# Patient Record
Sex: Female | Born: 1990 | Race: Black or African American | Hispanic: No | Marital: Single | Smoking: Never smoker
Health system: Southern US, Community
[De-identification: ages and names within clinical notes are randomized; demographics above are authoritative.]

## PROBLEM LIST (undated history)

## (undated) DIAGNOSIS — Z789 Other specified health status: Secondary | ICD-10-CM

## (undated) HISTORY — PX: NO PAST SURGERIES: SHX2092

---

## 2020-06-08 ENCOUNTER — Encounter (HOSPITAL_COMMUNITY): Payer: Self-pay | Admitting: Emergency Medicine

## 2020-06-08 ENCOUNTER — Other Ambulatory Visit: Payer: Self-pay

## 2020-06-08 ENCOUNTER — Inpatient Hospital Stay (HOSPITAL_COMMUNITY): Payer: Medicaid Other

## 2020-06-08 ENCOUNTER — Inpatient Hospital Stay (HOSPITAL_COMMUNITY)
Admission: AD | Admit: 2020-06-08 | Discharge: 2020-06-08 | Disposition: A | Discharge status: Home / Self Care | Payer: Medicaid Other | Attending: Obstetrics and Gynecology | Admitting: Obstetrics and Gynecology

## 2020-06-08 DIAGNOSIS — O23591 Infection of other part of genital tract in pregnancy, first trimester: Secondary | ICD-10-CM

## 2020-06-08 DIAGNOSIS — A5901 Trichomonal vulvovaginitis: Secondary | ICD-10-CM | POA: Diagnosis not present

## 2020-06-08 DIAGNOSIS — Z3A01 Less than 8 weeks gestation of pregnancy: Secondary | ICD-10-CM | POA: Diagnosis not present

## 2020-06-08 DIAGNOSIS — O98311 Other infections with a predominantly sexual mode of transmission complicating pregnancy, first trimester: Secondary | ICD-10-CM | POA: Diagnosis not present

## 2020-06-08 DIAGNOSIS — O210 Mild hyperemesis gravidarum: Secondary | ICD-10-CM | POA: Diagnosis not present

## 2020-06-08 DIAGNOSIS — O26891 Other specified pregnancy related conditions, first trimester: Secondary | ICD-10-CM | POA: Insufficient documentation

## 2020-06-08 DIAGNOSIS — R109 Unspecified abdominal pain: Secondary | ICD-10-CM | POA: Diagnosis not present

## 2020-06-08 DIAGNOSIS — O4691 Antepartum hemorrhage, unspecified, first trimester: Secondary | ICD-10-CM | POA: Diagnosis not present

## 2020-06-08 DIAGNOSIS — O26851 Spotting complicating pregnancy, first trimester: Secondary | ICD-10-CM | POA: Diagnosis not present

## 2020-06-08 DIAGNOSIS — O219 Vomiting of pregnancy, unspecified: Secondary | ICD-10-CM | POA: Insufficient documentation

## 2020-06-08 DIAGNOSIS — O209 Hemorrhage in early pregnancy, unspecified: Secondary | ICD-10-CM

## 2020-06-08 DIAGNOSIS — Z3491 Encounter for supervision of normal pregnancy, unspecified, first trimester: Secondary | ICD-10-CM

## 2020-06-08 HISTORY — DX: Other specified health status: Z78.9

## 2020-06-08 LAB — CBC
HCT: 36.5 % (ref 36.0–46.0)
Hemoglobin: 11.6 g/dL — ABNORMAL LOW (ref 12.0–15.0)
MCH: 27.9 pg (ref 26.0–34.0)
MCHC: 31.8 g/dL (ref 30.0–36.0)
MCV: 87.7 fL (ref 80.0–100.0)
Platelets: 327 10*3/uL (ref 150–400)
RBC: 4.16 MIL/uL (ref 3.87–5.11)
RDW: 14.6 % (ref 11.5–15.5)
WBC: 9.4 10*3/uL (ref 4.0–10.5)
nRBC: 0 % (ref 0.0–0.2)

## 2020-06-08 LAB — URINALYSIS, ROUTINE W REFLEX MICROSCOPIC
Bilirubin Urine: NEGATIVE
Glucose, UA: NEGATIVE mg/dL
Ketones, ur: 80 mg/dL — AB
Nitrite: NEGATIVE
Protein, ur: 30 mg/dL — AB
Specific Gravity, Urine: 1.032 — ABNORMAL HIGH (ref 1.005–1.030)
pH: 5 (ref 5.0–8.0)

## 2020-06-08 LAB — HCG, QUANTITATIVE, PREGNANCY: hCG, Beta Chain, Quant, S: 52064 m[IU]/mL — ABNORMAL HIGH (ref <5)

## 2020-06-08 LAB — WET PREP, GENITAL
Sperm: NONE SEEN
Yeast Wet Prep HPF POC: NONE SEEN

## 2020-06-08 LAB — POC URINE PREG, ED: Preg Test, Ur: POSITIVE — AB

## 2020-06-08 MED ORDER — PROMETHAZINE HCL 25 MG PO TABS: 25.0000 mg | ORAL_TABLET | Freq: Four times a day (QID) | ORAL | 0 refills | Status: DC | PRN

## 2020-06-08 MED ORDER — METRONIDAZOLE IVPB CUSTOM
2000.0000 mg | Freq: Once | INTRAVENOUS | Status: AC
  Administered 2020-06-08: 2000 mg via INTRAVENOUS
  Filled 2020-06-08: qty 400

## 2020-06-08 MED ORDER — FAMOTIDINE IN NACL 20-0.9 MG/50ML-% IV SOLN
20.0000 mg | Freq: Once | INTRAVENOUS | Status: AC
  Administered 2020-06-08: 20 mg via INTRAVENOUS
  Filled 2020-06-08: qty 50

## 2020-06-08 MED ORDER — LACTATED RINGERS IV BOLUS
1000.0000 mL | Freq: Once | INTRAVENOUS | Status: AC
  Administered 2020-06-08: 1000 mL via INTRAVENOUS

## 2020-06-08 MED ORDER — SODIUM CHLORIDE 0.9 % IV SOLN
25.0000 mg | Freq: Once | INTRAVENOUS | Status: AC
  Administered 2020-06-08: 25 mg via INTRAVENOUS
  Filled 2020-06-08: qty 1

## 2020-06-08 NOTE — Discharge Instructions (Signed)
[facility] [employer] for [employer] at [facility]  [address] [place], [place] [address]  [phone]  Center for [facility] at [facility]  [address], [place], [place] [address]  [phone]  Center for [facility] at [facility] [address] [place], [place] [address]  [phone]  Center for [facility] at [facility]  [address] [place], [place] [address]  [phone]  Center for [facility] at [facility]  [address] floor), [place], [place] [address]  [phone]  Center for [facility] [address], [place], [place] [address] [phone]  Center for [facility] at [facility]  [address] [place], [place], [place] [address]  [phone]  [facility]  [address], [place], [place] [address]  [phone]  [facility]  [address] [place], [place], [place] [address]  [phone]  [facility]  [address] [place], [place] [address]  [phone]  [facility]  [address] [place], [place] [address]  [phone]  [facility]  [address], [place], [place] [address]  [phone]  [facility]   [address] [place], [place] [address]  [phone]  [facility]  [address], [place], [place] [address]   [phone]  [facility]  [address] [place], [place] [address]  [phone]          Trichomoniasis Trichomoniasis is an STI (sexually transmitted infection) that can affect both women and men. In women, the outer area of the female genitalia (vulva) and the vagina are affected. In men, mainly the penis is affected, but the prostate and other reproductive organs can also be involved.  This condition can be treated with medicine. It often has no symptoms (is asymptomatic), especially in men. If not  treated, trichomoniasis can last for months or years. What are the causes? This condition is caused by a parasite called Trichomonas vaginalis. Trichomoniasis most often spreads from person to person (is contagious) through sexual contact. What increases the risk? The following factors may make you more likely to develop this condition:  Having unprotected sex.  Having sex with a partner who has trichomoniasis.  Having multiple sexual partners.  Having had previous trichomoniasis infections or other STIs. What are the signs or symptoms? In women, symptoms of trichomoniasis include:  Abnormal vaginal discharge that is clear, white, gray, or yellow-green and foamy and has an unusual "fishy" odor.  Itching and irritation of the vagina and vulva.  Burning or pain during urination or sex.  Redness and swelling of the genitals. In men, symptoms of trichomoniasis include:  Penile discharge that may be foamy or contain pus.  Pain in the penis. This may happen only when urinating.  Itching or irritation inside the penis.  Burning after urination or ejaculation. How is this diagnosed? In women, this condition may be found during a routine Pap test or physical exam. It may be found in men during a routine physical exam. Your health care provider may do tests to help diagnose this infection, such as:  Urine tests (men and women).  The following in women: ? Testing the pH of the vagina. ? A vaginal swab test that checks for the Trichomonas vaginalis parasite. ? Testing vaginal secretions. Your health care provider may test you for other STIs,  including HIV (human immunodeficiency virus). How is this treated? This condition is treated with medicine taken by mouth (orally), such as metronidazole or tinidazole, to fight the infection. Your sexual partner(s) also need to be tested and treated.  If you are a woman and you plan to become pregnant or think you may be pregnant, tell your  health care provider right away. Some medicines that are used to treat the infection should not be taken during pregnancy. Your health care provider may recommend over-the-counter medicines or creams to help relieve itching or irritation. You may be tested for infection again 3 months after treatment.   Follow these instructions at home:  Take and use over-the-counter and prescription medicines, including creams, only as told by your health care provider.  Take your antibiotic medicine as told by your health care provider. Do not stop taking the antibiotic even if you start to feel better.  Do not have sex until 7-10 days after you finish your medicine, or until your health care provider approves. Ask your health care provider when you may start to have sex again.  (Women) Do not douche or wear tampons while you have the infection.  Discuss your infection with your sexual partner(s). Make sure that your partner gets tested and treated, if necessary.  Keep all follow-up visits as told by your health care provider. This is important. How is this prevented?  Use condoms every time you have sex. Using condoms correctly and consistently can help protect against STIs.  Avoid having multiple sexual partners.  Talk with your sexual partner about any symptoms that either of you may have, as well as any history of STIs.  Get tested for STIs and STDs (sexually transmitted diseases) before you have sex. Ask your partner to do the same.  Do not have sexual contact if you have symptoms of trichomoniasis or another STI.   Contact a health care provider if:  You still have symptoms after you finish your medicine.  You develop pain in your abdomen.  You have pain when you urinate.  You have bleeding after sex.  You develop a rash.  You feel nauseous or you vomit.  You plan to become pregnant or think you may be pregnant. Summary  Trichomoniasis is an STI (sexually transmitted infection)  that can affect both women and men.  This condition often has no symptoms (is asymptomatic), especially in men.  Without treatment, this condition can last for months or years.  You should not have sex until 7-10 days after you finish your medicine, or until your health care provider approves. Ask your health care provider when you may start to have sex again.  Discuss your infection with your sexual partner(s). Make sure that your partner gets tested and treated, if necessary. This information is not intended to replace advice given to you by your health care provider. Make sure you discuss any questions you have with your health care provider. Document Revised: 12/14/2017 Document Reviewed: 12/14/2017 Elsevier Patient Education  2021 Elsevier Inc.       Morning Sickness  Morning sickness is when a woman feels nauseous during pregnancy. This nauseous feeling may or may not come with vomiting. It often occurs in the morning, but it can be a problem at any time of day. Morning sickness is most common during the first trimester. In some cases, it may continue throughout pregnancy. Although morning sickness is unpleasant, it is usually harmless unless the woman develops severe and continual vomiting (hyperemesis  gravidarum), a condition that requires more intense treatment. What are the causes? The exact cause of this condition is not known, but it seems to be related to normal hormonal changes that occur in pregnancy. What increases the risk? You are more likely to develop this condition if:  You experienced nausea or vomiting before your pregnancy.  You had morning sickness during a previous pregnancy.  You are pregnant with more than one baby, such as twins. What are the signs or symptoms? Symptoms of this condition include:  Nausea.  Vomiting. How is this diagnosed? This condition is usually diagnosed based on your signs and symptoms. How is this treated? In many cases,  treatment is not needed for this condition. Making some changes to what you eat may help to control symptoms. Your health care provider may also prescribe or recommend:  Vitamin B6 supplements.  Anti-nausea medicines.  Ginger. Follow these instructions at home: Medicines  Take over-the-counter and prescription medicines only as told by your health care provider. Do not use any prescription, over-the-counter, or herbal medicines for morning sickness without first talking with your health care provider.  Take multivitamins before getting pregnant. This can prevent or decrease the severity of morning sickness in most women. Eating and drinking  Eat a piece of dry toast or crackers before getting out of bed in the morning.  Eat 5 or 6 small meals a day.  Eat dry and bland foods, such as rice or a baked potato. Foods that are high in carbohydrates are often helpful.  Avoid greasy, fatty, and spicy foods.  Have someone cook for you if the smell of any food causes nausea and vomiting.  If you feel nauseous after taking prenatal vitamins, take the vitamins at night or with a snack.  Eat a protein snack between meals if you are hungry. Nuts, yogurt, and cheese are good options.  Drink fluids throughout the day.  Try ginger ale made with real ginger, ginger tea made from fresh grated ginger, or ginger candies. General instructions  Do not use any products that contain nicotine or tobacco. These products include cigarettes, chewing tobacco, and vaping devices, such as e-cigarettes. If you need help quitting, ask your health care provider.  Get an air purifier to keep the air in your house free of odors.  Get plenty of fresh air.  Try to avoid odors that trigger your nausea.  Consider trying these methods to help relieve symptoms: ? Wearing an acupressure wristband. These wristbands are often worn for seasickness. ? Acupuncture. Contact a health care provider if:  Your home  remedies are not working and you need medicine.  You feel dizzy or light-headed.  You are losing weight. Get help right away if:  You have persistent and uncontrolled nausea and vomiting.  You faint.  You have severe pain in your abdomen. Summary  Morning sickness is when a woman feels nauseous during pregnancy. This nauseous feeling may or may not come with vomiting.  Morning sickness is most common during the first trimester.  It often occurs in the morning, but it can be a problem at any time of day.  In many cases, treatment is not needed for this condition. Making some changes to what you eat may help to control symptoms. This information is not intended to replace advice given to you by your health care provider. Make sure you discuss any questions you have with your health care provider. Document Revised: 10/16/2019 Document Reviewed: 09/25/2019 Elsevier Patient Education  [place].

## 2020-06-08 NOTE — ED Triage Notes (Addendum)
Pt sates she is 2 months pregnant.  C/o nausea and vomiting x 5 1/2 days.  G-1.  Denies pain.

## 2020-06-08 NOTE — MAU Provider Note (Signed)
History     CSN: [id number]  Arrival date and time: 06/08/20 1714   Event Date/Time   First Provider Initiated Contact with Patient 06/08/20 1920      Chief Complaint  Patient presents with  . Vomiting  . Abdominal Pain  . Vaginal Bleeding   HPI Amber Shea is a 30 y.o. G1P0 at [redacted]w[redacted]d who presents with n/v & vaginal bleeding. Reports n/v for the last 5 days. Vomits at least 5 times per day. Does not have antiemetic at home & has not treated her symptoms. Also reports some vaginal bleeding for the last few days. Describes intermittent vaginal spotting when she wipes that alternates pink & red/brown. Not saturating pads or passing blood clots. Has noticed an increase in foul smelling vaginal discharge. No vaginal itching/irritation.   Denies abdominal pain.   OB History    Gravida  1   Para      Term      Preterm      AB      Living        SAB      IAB      Ectopic      Multiple      Live Births              Past Medical History:  Diagnosis Date  . Medical history non-contributory     Past Surgical History:  Procedure Laterality Date  . NO PAST SURGERIES      History reviewed. No pertinent family history.  Social History   Tobacco Use  . Smoking status: Never Smoker  . Smokeless tobacco: Never Used  Vaping Use  . Vaping Use: Never used  Substance Use Topics  . Alcohol use: Not Currently  . Drug use: Not Currently    Types: Marijuana    Comment: "I stopped when I found out I was pregnant"    Allergies: No Known Allergies  No medications prior to admission.    Review of Systems  Constitutional: Negative.   Gastrointestinal: Positive for nausea and vomiting. Negative for abdominal pain, constipation and diarrhea.  Genitourinary: Positive for vaginal bleeding and vaginal discharge. Negative for dysuria.   Physical Exam   Blood pressure 93/68, pulse 78, temperature 98.1 F (36.7 C), temperature source Oral, resp. rate 16, height 5' 2.5"  (1.588 m), weight 57.6 kg, last menstrual period 05/01/2020, SpO2 99 %.  Physical Exam Vitals and nursing note reviewed.  Constitutional:      General: She is not in acute distress.    Appearance: She is well-developed and normal weight.  HENT:     Head: Normocephalic and atraumatic.  Pulmonary:     Effort: Pulmonary effort is normal. No respiratory distress.  Skin:    General: Skin is warm and dry.  Neurological:     Mental Status: She is alert.  Psychiatric:        Mood and Affect: Mood normal.        Behavior: Behavior normal.     MAU Course  Procedures Results for orders placed or performed during the hospital encounter of 06/08/20 (from the past 24 hour(s))  POC Urine Pregnancy, ED (not at [facility])     Status: Abnormal   Collection Time: 06/08/20  5:29 PM  Result Value Ref Range   Preg Test, Ur POSITIVE (A) NEGATIVE  Urinalysis, Routine w reflex microscopic Urine, Clean Catch     Status: Abnormal   Collection Time: 06/08/20  6:56 PM  Result Value Ref  Range   Color, Urine AMBER (A) YELLOW   APPearance CLOUDY (A) CLEAR   Specific Gravity, Urine 1.032 (H) 1.005 - 1.030   pH 5.0 5.0 - 8.0   Glucose, UA NEGATIVE NEGATIVE mg/dL   Hgb urine dipstick MODERATE (A) NEGATIVE   Bilirubin Urine NEGATIVE NEGATIVE   Ketones, ur 80 (A) NEGATIVE mg/dL   Protein, ur 30 (A) NEGATIVE mg/dL   Nitrite NEGATIVE NEGATIVE   Leukocytes,Ua TRACE (A) NEGATIVE   RBC / HPF 0-5 0 - 5 RBC/hpf   WBC, UA 0-5 0 - 5 WBC/hpf   Bacteria, UA RARE (A) NONE SEEN   Squamous Epithelial / LPF 11-20 0 - 5   Mucus PRESENT   CBC     Status: Abnormal   Collection Time: 06/08/20  7:38 PM  Result Value Ref Range   WBC 9.4 4.0 - 10.5 K/uL   RBC 4.16 3.87 - 5.11 MIL/uL   Hemoglobin 11.6 (L) 12.0 - 15.0 g/dL   HCT 41.9 37.9 - 02.4 %   MCV 87.7 80.0 - 100.0 fL   MCH 27.9 26.0 - 34.0 pg   MCHC 31.8 30.0 - 36.0 g/dL   RDW 09.7 35.3 - 29.9 %   Platelets 327 150 - 400 K/uL   nRBC 0.0 0.0 - 0.2 %  ABO/Rh      Status: None   Collection Time: 06/08/20  7:38 PM  Result Value Ref Range   ABO/RH(D)      O POS Performed at [facility], [address] [place], [place] [address]   hCG, quantitative, pregnancy     Status: Abnormal   Collection Time: 06/08/20  7:38 PM  Result Value Ref Range   hCG, Beta Chain, Quant, S 52,064 (H) <5 mIU/mL  Wet prep, genital     Status: Abnormal   Collection Time: 06/08/20  7:38 PM   Specimen: PATH Cytology Cervicovaginal Ancillary Only  Result Value Ref Range   Yeast Wet Prep HPF POC NONE SEEN NONE SEEN   Trich, Wet Prep PRESENT (A) NONE SEEN   Clue Cells Wet Prep HPF POC PRESENT (A) NONE SEEN   WBC, Wet Prep HPF POC MANY (A) NONE SEEN   Sperm NONE SEEN    US OB LESS THAN 14 WEEKS WITH OB TRANSVAGINAL  Result Date: 06/08/2020 CLINICAL DATA:  Vaginal bleeding, nausea and vomiting, beta HCG 52,064 EXAM: OBSTETRIC <14 WK [place] AND TRANSVAGINAL OB US TECHNIQUE: Both transabdominal and transvaginal ultrasound examinations were performed for complete evaluation of the gestation as well as the maternal uterus, adnexal regions, and pelvic cul-de-sac. Transvaginal technique was performed to assess early pregnancy. COMPARISON:  None. FINDINGS: Intrauterine gestational sac: Single Yolk sac:  Visualized. Embryo:  Visualized. Cardiac Activity: Visualized. Heart Rate: 92 bpm CRL: 2.2 mm   5 w   5 d                  [place] EDC: 02/03/2021 Subchorionic hemorrhage: Small subchorionic hemorrhage measures 1.5 x 0.5 x 1.5 cm along the inferior margin of the gestational sac. Maternal uterus/adnexae: No free fluid. No adnexal masses. Right ovary measures 3.4 x 2.8 x 2.1 cm and the left ovary measures 4.7 x 2.4 x 2.6 cm. IMPRESSION: 1. Single live intrauterine pregnancy as above estimated age 13 weeks and 5 days. 2. Small subchorionic hemorrhage. Electronically Signed   By: Sharlet Salina M.D.   On: 06/08/2020 21:24    MDM +UPT UA, wet prep, GC/chlamydia, CBC, ABO/Rh, quant hCG, and  [place] today  to rule out ectopic pregnancy which can be life threatening.   Ultrasound shows live iup  RH positive  Wet prep + trich. Pt vomiting. Given IV flagy 2 gm in MAU  IV fluids, phenergan, & pepcid given - pt reports improvement in symptoms  Assessment and Plan   1. Vomiting during pregnancy   2. Vaginal bleeding in pregnancy, first trimester   3. Normal IUP (intrauterine pregnancy) on prenatal ultrasound, first trimester   4. [redacted] weeks gestation of pregnancy   5. Trichomonal vaginitis during pregnancy in first trimester    -Rx phenergan -given list of ob/gyns - start prenatal care -no intercourse x 1 week after tric tx -partner needs to be treated for trich -GC/CT pending -reviewed reasons to return to MAU  Judeth Horn 06/08/2020, 9:46 PM

## 2020-06-08 NOTE — MAU Note (Signed)
Pt reports to mau from [facility] with c/o nausea and vomiting for the past several days.  Pt also reports lower abd cramps and some spotting when she wipes

## 2020-06-08 NOTE — ED Provider Notes (Signed)
Emergency Medicine Provider OB Triage Evaluation Note  Amber Shea is a 30 y.o. female, G1P0, at [redacted]w[redacted]d gestation (LMP 05/01/20)who presents to the emergency department with complaints of 5 days of nausea and vomiting.  She states she is vomiting about 4-5 times a day.  She reports occasional approx 30 second sharp pains in her abdomen however notes that these are migratory however notes that she has had spotting intermittently for the past 3 to 4 days.  It is not heavy enough for her to wear a pad and is primarily there when she wipes.  She has not had any prenatal care so far this pregnancy.  She denies any fevers.  Review of  Systems  Positive: Spotting, N/V  Negative: Fevers  Physical Exam  BP 106/88 (BP Location: Right Arm)   Pulse 74   Temp 99 F (37.2 C)   Resp 16   SpO2 100%  General: Awake, no distress  HEENT: Atraumatic  Resp: Normal effort  Cardiac: Normal rate Abd: Nondistended, nontender  MSK: Moves all extremities without difficulty Neuro: Speech clear  Medical Decision Making  Pt evaluated for pregnancy concern and is stable for transfer to MAU. Pt is in agreement with plan for transfer.  6:11 PM Discussed with MAU APP who accepts patient in transfer.  Clinical Impression   1. Vomiting during pregnancy        Norman Clay 06/08/20 1811    Mancel Bale, MD 06/08/20 [phone]

## 2020-06-08 NOTE — ED Notes (Addendum)
MSE completed by Jeraldine Loots, PA at triage.  Report called to [facility] in MAU.

## 2020-06-10 LAB — GC/CHLAMYDIA PROBE AMP (~~LOC~~) NOT AT ARMC
Chlamydia: POSITIVE — AB
Comment: NEGATIVE
Comment: NORMAL
Neisseria Gonorrhea: NEGATIVE

## 2020-06-11 ENCOUNTER — Inpatient Hospital Stay (HOSPITAL_COMMUNITY)
Admission: AD | Admit: 2020-06-11 | Discharge: 2020-06-11 | Disposition: A | Discharge status: Home / Self Care | Payer: Medicaid Other | Attending: Obstetrics and Gynecology | Admitting: Obstetrics and Gynecology

## 2020-06-11 ENCOUNTER — Other Ambulatory Visit: Payer: Self-pay

## 2020-06-11 ENCOUNTER — Encounter (HOSPITAL_COMMUNITY): Payer: Self-pay | Admitting: Obstetrics and Gynecology

## 2020-06-11 DIAGNOSIS — O98811 Other maternal infectious and parasitic diseases complicating pregnancy, first trimester: Secondary | ICD-10-CM | POA: Diagnosis not present

## 2020-06-11 DIAGNOSIS — O211 Hyperemesis gravidarum with metabolic disturbance: Secondary | ICD-10-CM | POA: Diagnosis not present

## 2020-06-11 DIAGNOSIS — Z3A01 Less than 8 weeks gestation of pregnancy: Secondary | ICD-10-CM | POA: Diagnosis not present

## 2020-06-11 DIAGNOSIS — O99281 Endocrine, nutritional and metabolic diseases complicating pregnancy, first trimester: Secondary | ICD-10-CM | POA: Insufficient documentation

## 2020-06-11 DIAGNOSIS — O219 Vomiting of pregnancy, unspecified: Secondary | ICD-10-CM | POA: Diagnosis not present

## 2020-06-11 DIAGNOSIS — E860 Dehydration: Secondary | ICD-10-CM | POA: Diagnosis not present

## 2020-06-11 DIAGNOSIS — A749 Chlamydial infection, unspecified: Secondary | ICD-10-CM

## 2020-06-11 LAB — URINALYSIS, ROUTINE W REFLEX MICROSCOPIC
Bilirubin Urine: NEGATIVE
Glucose, UA: NEGATIVE mg/dL
Ketones, ur: 80 mg/dL — AB
Nitrite: NEGATIVE
Protein, ur: 30 mg/dL — AB
Specific Gravity, Urine: 1.028 (ref 1.005–1.030)
Squamous Epithelial / HPF: >50 — ABNORMAL HIGH (ref 0–5)
pH: 6 (ref 5.0–8.0)

## 2020-06-11 LAB — ABO/RH: ABO/RH(D): O POS

## 2020-06-11 MED ORDER — FAMOTIDINE IN NACL 20-0.9 MG/50ML-% IV SOLN
20.0000 mg | Freq: Once | INTRAVENOUS | Status: AC
  Administered 2020-06-11: 20 mg via INTRAVENOUS
  Filled 2020-06-11: qty 50

## 2020-06-11 MED ORDER — LACTATED RINGERS IV BOLUS
1000.0000 mL | Freq: Once | INTRAVENOUS | Status: AC
  Administered 2020-06-11: 1000 mL via INTRAVENOUS

## 2020-06-11 MED ORDER — SODIUM CHLORIDE 0.9 % IV SOLN
25.0000 mg | Freq: Once | INTRAVENOUS | Status: AC
  Administered 2020-06-11: 25 mg via INTRAVENOUS
  Filled 2020-06-11: qty 1

## 2020-06-11 MED ORDER — AZITHROMYCIN 1 G PO PACK
1.0000 g | PACK | Freq: Once | ORAL | Status: AC
  Administered 2020-06-11: 1 g via ORAL
  Filled 2020-06-11: qty 1

## 2020-06-11 MED ORDER — AZITHROMYCIN 250 MG PO TABS: 1000.0000 mg | ORAL_TABLET | Freq: Once | ORAL | Status: DC

## 2020-06-11 NOTE — MAU Provider Note (Addendum)
History     [id number]  Arrival date and time: 06/11/20 1756    Chief Complaint  Patient presents with  . Nausea  . Emesis     HPI Amber Shea is a 30 y.o. at [redacted]w[redacted]d by early ultrasound with PMHx notable for trichomonas, who presents for nausea & vomiting. Was seen in MAU on 3/26 for same. Was discharged home with rx phenergan. Hasn't taken medication today because she said she couldn't keep the pill down. Denies abdominal pain, vaginal bleeding, diarrhea, fever, or vaginal discharge.    --/--/O POS (03/26 1938)  OB History    Gravida  1   Para      Term      Preterm      AB      Living        SAB      IAB      Ectopic      Multiple      Live Births              Past Medical History:  Diagnosis Date  . Medical history non-contributory     Past Surgical History:  Procedure Laterality Date  . NO PAST SURGERIES      History reviewed. No pertinent family history.  Social History   Socioeconomic History  . Marital status: Single    Spouse name: Not on file  . Number of children: Not on file  . Years of education: Not on file  . Highest education level: Not on file  Occupational History  . Not on file  Tobacco Use  . Smoking status: Never Smoker  . Smokeless tobacco: Never Used  Vaping Use  . Vaping Use: Never used  Substance and Sexual Activity  . Alcohol use: Not Currently  . Drug use: Not Currently    Types: Marijuana    Comment: "I stopped when I found out I was pregnant"  . Sexual activity: Not Currently  Other Topics Concern  . Not on file  Social History Narrative  . Not on file   Social Determinants of Health   Financial Resource Strain: Not on file  Food Insecurity: Not on file  Transportation Needs: Not on file  Physical Activity: Not on file  Stress: Not on file  Social Connections: Not on file  Intimate Partner Violence: Not on file    No Known Allergies  No current facility-administered medications on file  prior to encounter.   Current Outpatient Medications on File Prior to Encounter  Medication Sig Dispense Refill  . promethazine (PHENERGAN) 25 MG tablet Take 1 tablet (25 mg total) by mouth every 6 (six) hours as needed for nausea or vomiting. 30 tablet 0     Review of Systems  Constitutional: Negative.   Gastrointestinal: Positive for nausea and vomiting. Negative for abdominal pain and diarrhea.  Genitourinary: Negative.    Pertinent positives and negative per HPI, all others reviewed and negative  Physical Exam   BP 110/76   Pulse 67   Temp 98.4 F (36.9 C) (Oral)   Resp 16   Ht 5' 2.5" (1.588 m)   Wt 55.4 kg   LMP 05/01/2020   SpO2 99%   BMI 21.98 kg/m   Physical Exam Vitals and nursing note reviewed.  Constitutional:      General: She is not in acute distress.    Appearance: Normal appearance. She is normal weight.  HENT:     Head: Normocephalic and atraumatic.  Mouth/Throat:     Mouth: Mucous membranes are dry.  Skin:    General: Skin is warm and dry.  Neurological:     Mental Status: She is alert.  Psychiatric:        Mood and Affect: Mood normal.        Behavior: Behavior normal.     Labs No results found for this or any previous visit (from the past 24 hour(s)).  Imaging No results found.  MAU Course  Procedures  Lab Orders     Urinalysis, Routine w reflex microscopic Urine, Clean Catch Meds ordered this encounter  Medications  . lactated ringers bolus 1,000 mL  . lactated ringers bolus 1,000 mL  . famotidine (PEPCID) IVPB 20 mg premix  . promethazine (PHENERGAN) 25 mg in sodium chloride 0.9 % 50 mL IVPB  . DISCONTD: azithromycin (ZITHROMAX) tablet 1,000 mg  . azithromycin (ZITHROMAX) powder 1 g   Imaging Orders  No imaging studies ordered today    MDM IV fluids, pepcid, & phenergan ordered  Pt treated for trichomonas on 3/26. GC/CT returned today & she is positive for chlamydia. Will given azithromycin after phenergan on board.    Care turned over to Wynelle Bourgeois CNM Judeth Horn, NP 06/11/2020 8:07 PM   Completed IV fluids and was given Zithromax elixer Was able to keep it down as well as fluids No vomiting   Assessment and Plan  Single IUP at [redacted]w[redacted]d Nausea and vomiting Mild dehydration Chlamydia in pregnancy, treated Trichomonas, treated last visit  Discharge home Has new Rx for Phenergan, reiterated she can use it vaginally prn Advance diet as tolerated Followup at HD or office  Encouraged to return if she develops worsening of symptoms, increase in pain, fever, or other concerning symptoms.    Aviva Signs, CNM

## 2020-06-11 NOTE — Discharge Instructions (Signed)
Chlamydia, Female Chlamydia is an STI (sexually transmitted infection) that is caused by bacteria. This infection spreads through sexual contact. Chlamydia can occur in different areas of the body, including:  The urethra. This is the part of the body that drains urine from the bladder.  The cervix. This is the lowest part of the uterus.  The throat.  The rectum. This condition is not difficult to treat. However, if left untreated, chlamydia can lead to more serious health problems, including pelvic inflammatory disease (PID). PID can increase your risk of being unable to have children. Also, women with untreated chlamydia who are pregnant or become pregnant can spread the infection to their babies during delivery. This may cause serious health problems for their babies. What are the causes? This condition is caused by the bacteria Chlamydia trachomatis. The bacteria are spread from an infected partner during sexual activity. Chlamydia can spread through contact with the genitals, mouth, or rectum. What increases the risk? The following factors may make you more likely to develop this condition:  Not using a condom the right way or not using a condom every time you have sex.  Having a new sex partner or having more than one sex partner.  Being female, age 28-25, and sexually active. What are the signs or symptoms? In some cases, there are no symptoms, especially early in the infection. Having no symptoms is also called being asymptomatic. If symptoms develop, they may include:  Urinating often, or a burning feeling during urination.  Discharge from the vagina.  Redness, soreness, or swelling of the rectum, or bleeding or discharge coming from the rectum. Any of these may occur if the infection was spread through anal sex.  Pain in the abdomen.  Pain during sex.  Bleeding between menstrual periods or irregular periods.  Itching, burning, or redness in the eyes, or discharge from  the eyes. How is this diagnosed? This condition may be diagnosed with:  Urine tests.  Swab tests. Depending on your symptoms, your health care provider may use a cotton swab to collect discharge from your vagina or rectum to test for the bacteria.  A pelvic exam. How is this treated? This condition is treated with antibiotic medicines. If you are pregnant, you will need to avoid certain types of antibiotics. Follow these instructions at home: Sexual activity  Tell your sex partner or partners about your infection. These include any partners for oral, anal, or vaginal sex that you have had within 60 days of when your symptoms started. Sex partners should also be treated, even if they have no signs of the disease.  Do not have sex until you and your sex partners have completed treatment and your health care provider says it is okay. If your health care provider prescribed you a single-dose medicine as treatment, wait at least 7 days after taking the medicine before having sex. General instructions  Take over-the-counter and prescription medicines as told by your health care provider. Finish all antibiotic medicine even when you start to feel better.  It is up to you to get your test results. Ask your health care provider, or the department that is doing the test, when your results will be ready.  Get plenty of rest.  Eat a healthy, well-balanced diet.  Drink enough fluids to keep your urine pale yellow.  Keep all follow-up visits as told by your health care provider. This is important. You may need to be tested for infection again 3 months after treatment. How  is this prevented? The only sure way to prevent chlamydia is to avoid having vaginal, anal, and oral sex. However, you can lower your risk by:  Using latex condoms correctly every time you have sex.  Not having multiple sex partners.  Asking if your sex partner has been tested for STIs and had negative results.  Getting  regular health screenings to check for STIs.   Contact a health care provider if:  You develop new symptoms or your symptoms do not get better after you complete treatment.  You have a fever or chills.  You have pain during sex.  You develop new joint pain or swelling near your joints.  You have irregular menstrual periods, or you have bleeding between periods or after sex.  You develop flu-like symptoms, such as night sweats, sore throat, or muscle aches.  You are pregnant and you develop symptoms of chlamydia. Get help right away if:  Your pain gets worse and does not get better with medicine.  You have pain in your abdomen or lower back that does not get better with medicine.  You feel weak or dizzy, or you faint. Summary  Chlamydia is an STI (sexually transmitted infection) that is caused by bacteria. This infection spreads through sexual contact.  This condition is not difficult to treat. However, if left untreated, chlamydia can lead to more serious health problems, including pelvic inflammatory disease (PID).  Some people have no symptoms (are asymptomatic), especially early in the infection.  This condition is treated with antibiotic medicines.  Using latex condoms correctly every time you have sex can help prevent chlamydia. This information is not intended to replace advice given to you by your health care provider. Make sure you discuss any questions you have with your health care provider. Document Revised: 02/27/2019 Document Reviewed: 02/27/2019 Elsevier Patient Education  2021 Elsevier Inc.  Morning Sickness  Morning sickness is when you feel like you may vomit (feel nauseous) during pregnancy. Sometimes, you may vomit. Morning sickness most often happens in the morning, but it can also happen at any time of the day. Some women may have morning sickness that makes them vomit all the time. This is a more serious problem that needs treatment. What are the  causes? The cause of this condition is not known. What increases the risk?  You had vomiting or a feeling like you may vomit before your pregnancy.  You had morning sickness in another pregnancy.  You are pregnant with more than one baby, such as twins. What are the signs or symptoms?  Feeling like you may vomit.  Vomiting. How is this treated? Treatment is usually not needed for this condition. You may only need to change what you eat. In some cases, your doctor may give you some things to take for your condition. These include:  Vitamin B6 supplements.  Medicines to treat the feeling that you may vomit.  Ginger. Follow these instructions at home: Medicines  Take over-the-counter and prescription medicines only as told by your doctor. Do not take any medicines until you talk with your doctor about them first.  Take multivitamins before you get pregnant. These can stop or lessen the symptoms of morning sickness. Eating and drinking  Eat dry toast or crackers before getting out of bed.  Eat 5 or 6 small meals a day.  Eat dry and bland foods like rice and baked potatoes.  Do not eat greasy, fatty, or spicy foods.  Have someone cook for you if  the smell of food causes you to vomit or to feel like you may vomit.  If you feel like you may vomit after taking prenatal vitamins, take them at night or with a snack.  Eat protein foods when you need a snack. Nuts, yogurt, and cheese are good choices.  Drink fluids throughout the day.  Try ginger ale made with real ginger, ginger tea made from fresh grated ginger, or ginger candies. General instructions  Do not smoke or use any products that contain nicotine or tobacco. If you need help quitting, ask your doctor.  Use an air purifier to keep the air in your house free of smells.  Get lots of fresh air.  Try to avoid smells that make you feel sick.  Try wearing an acupressure wristband. This is a wristband that is used to  treat seasickness.  Try a treatment called acupuncture. In this treatment, a doctor puts needles into certain areas of your body to make you feel better. Contact a doctor if:  You need medicine to feel better.  You feel dizzy or light-headed.  You are losing weight. Get help right away if:  The feeling that you may vomit will not go away, or you cannot stop vomiting.  You faint.  You have very bad pain in your belly. Summary  Morning sickness is when you feel like you may vomit (feel nauseous) during pregnancy.  You may feel sick in the morning, but you can feel this way at any time of the day.  Making some changes to what you eat may help your symptoms go away. This information is not intended to replace advice given to you by your health care provider. Make sure you discuss any questions you have with your health care provider. Document Revised: 10/16/2019 Document Reviewed: 09/25/2019 Elsevier Patient Education  2021 [employer].

## 2020-06-11 NOTE — MAU Note (Signed)
Pt transported to rm via wc, pt unsteady on her feet.

## 2020-06-11 NOTE — MAU Note (Signed)
Ongoing vomiting. Can't keep anything down.  Having pain mid chest, burning- feels like something is stuck.  Throwing up dark yellow since Sat. Phenergan not working, last taken yesterday

## 2020-06-12 ENCOUNTER — Encounter: Payer: Self-pay | Admitting: Student

## 2020-06-12 DIAGNOSIS — O98311 Other infections with a predominantly sexual mode of transmission complicating pregnancy, first trimester: Secondary | ICD-10-CM | POA: Insufficient documentation

## 2020-06-12 DIAGNOSIS — A568 Sexually transmitted chlamydial infection of other sites: Secondary | ICD-10-CM | POA: Insufficient documentation

## 2020-06-12 DIAGNOSIS — A5901 Trichomonal vulvovaginitis: Secondary | ICD-10-CM | POA: Insufficient documentation

## 2020-06-27 ENCOUNTER — Other Ambulatory Visit: Payer: Self-pay

## 2020-06-27 ENCOUNTER — Inpatient Hospital Stay (HOSPITAL_COMMUNITY)
Admission: AD | Admit: 2020-06-27 | Discharge: 2020-06-27 | Disposition: A | Discharge status: Home / Self Care | Payer: Medicaid Other | Attending: Obstetrics and Gynecology | Admitting: Obstetrics and Gynecology

## 2020-06-27 ENCOUNTER — Encounter (HOSPITAL_COMMUNITY): Payer: Self-pay | Admitting: Obstetrics and Gynecology

## 2020-06-27 DIAGNOSIS — O219 Vomiting of pregnancy, unspecified: Secondary | ICD-10-CM | POA: Diagnosis not present

## 2020-06-27 DIAGNOSIS — O210 Mild hyperemesis gravidarum: Secondary | ICD-10-CM | POA: Diagnosis present

## 2020-06-27 DIAGNOSIS — A5901 Trichomonal vulvovaginitis: Secondary | ICD-10-CM | POA: Diagnosis not present

## 2020-06-27 DIAGNOSIS — A599 Trichomoniasis, unspecified: Secondary | ICD-10-CM

## 2020-06-27 DIAGNOSIS — Z3A08 8 weeks gestation of pregnancy: Secondary | ICD-10-CM | POA: Insufficient documentation

## 2020-06-27 DIAGNOSIS — O98311 Other infections with a predominantly sexual mode of transmission complicating pregnancy, first trimester: Secondary | ICD-10-CM | POA: Diagnosis not present

## 2020-06-27 LAB — URINALYSIS, ROUTINE W REFLEX MICROSCOPIC
Glucose, UA: NEGATIVE mg/dL
Ketones, ur: NEGATIVE mg/dL
Nitrite: NEGATIVE
Protein, ur: 100 mg/dL — AB
RBC / HPF: >50 RBC/hpf — ABNORMAL HIGH (ref 0–5)
Specific Gravity, Urine: 1.027 (ref 1.005–1.030)
WBC, UA: >50 WBC/hpf — ABNORMAL HIGH (ref 0–5)
pH: 7 (ref 5.0–8.0)

## 2020-06-27 MED ORDER — SCOPOLAMINE 1 MG/3DAYS TD PT72
1.0000 | MEDICATED_PATCH | Freq: Once | TRANSDERMAL | Status: DC
  Administered 2020-06-27: 1.5 mg via TRANSDERMAL
  Filled 2020-06-27: qty 1

## 2020-06-27 MED ORDER — METOCLOPRAMIDE HCL 10 MG PO TABS: 10.0000 mg | ORAL_TABLET | Freq: Three times a day (TID) | ORAL | 1 refills | Status: AC | PRN

## 2020-06-27 MED ORDER — METRONIDAZOLE 500 MG PO TABS
2000.0000 mg | ORAL_TABLET | Freq: Once | ORAL | Status: AC
  Administered 2020-06-27: 2000 mg via ORAL
  Filled 2020-06-27: qty 4

## 2020-06-27 MED ORDER — METOCLOPRAMIDE HCL 10 MG PO TABS
10.0000 mg | ORAL_TABLET | Freq: Once | ORAL | Status: AC
  Administered 2020-06-27: 10 mg via ORAL
  Filled 2020-06-27: qty 1

## 2020-06-27 MED ORDER — SCOPOLAMINE 1 MG/3DAYS TD PT72: 1.0000 | MEDICATED_PATCH | TRANSDERMAL | 1 refills | Status: AC

## 2020-06-27 NOTE — MAU Note (Signed)
Presents with c/o N/V, reports unable to keep anything down.  States taking Phenergan, but doesn't help, states makes vomiting "violent".  Also reports having pink milky vaginal discharge with white specks.  Also reports increased abdominal cramping for past 2 days.

## 2020-06-27 NOTE — MAU Provider Note (Signed)
History     CSN: [id number]  Arrival date and time: 06/27/20 1807   Event Date/Time   First Provider Initiated Contact with Patient 06/27/20 1859      Chief Complaint  Patient presents with  . Nausea  . Emesis  . Vaginal Discharge   HPI Amber Shea is a 30 y.o. G1P0 at [redacted]w[redacted]d who presents with n/v and vaginal discharge. Nausea & vomiting has been an ongoing issue with this pregnancy. Had been prescribed phenergan but reports it doesn't help so hasn't taken it in 2 weeks. Normally vomits 7+ times per day. Today has vomited 3 times. Has not treated her symptoms. Denies abdominal pain or vaginal bleeding.  She has noticed pink discharge that is foul smelling. Was treated for trichomonas on 3/26 & chlamydia on 3/29. Reports no intercourse since treatment.   OB History    Gravida  1   Para      Term      Preterm      AB      Living        SAB      IAB      Ectopic      Multiple      Live Births              Past Medical History:  Diagnosis Date  . Medical history non-contributory    Past Surgical History:  Procedure Laterality Date  . NO PAST SURGERIES      Family History  Problem Relation Age of Onset  . Healthy Mother   . Healthy Father     Social History   Tobacco Use  . Smoking status: Never Smoker  . Smokeless tobacco: Never Used  Vaping Use  . Vaping Use: Never used  Substance Use Topics  . Alcohol use: Not Currently  . Drug use: Not Currently    Types: Marijuana    Comment: "I stopped when I found out I was pregnant"    Allergies: No Known Allergies  Medications Prior to Admission  Medication Sig Dispense Refill Last Dose  . promethazine (PHENERGAN) 25 MG tablet Take 1 tablet (25 mg total) by mouth every 6 (six) hours as needed for nausea or vomiting. 30 tablet 0 Past Week at Unknown time    Review of Systems  Constitutional: Negative.   Gastrointestinal: Positive for nausea and vomiting. Negative for abdominal pain.   Genitourinary: Positive for vaginal discharge. Negative for dysuria and vaginal bleeding.   Physical Exam   Blood pressure 115/73, pulse 66, temperature 98.6 F (37 C), temperature source Oral, resp. rate 18, height 5\' 2"  (1.575 m), weight 57 kg, last menstrual period 05/01/2020, SpO2 100 %.  Physical Exam Vitals and nursing note reviewed.  Constitutional:      General: She is not in acute distress.    Appearance: Normal appearance. She is normal weight.  HENT:     Head: Normocephalic and atraumatic.  Pulmonary:     Effort: Pulmonary effort is normal. No respiratory distress.  Abdominal:     General: Abdomen is flat.     Tenderness: There is no abdominal tenderness.  Skin:    General: Skin is warm and dry.  Neurological:     Mental Status: She is alert.  Psychiatric:        Mood and Affect: Mood normal.        Behavior: Behavior normal.     MAU Course  Procedures Results for orders placed or performed during the hospital  encounter of 06/27/20 (from the past 24 hour(s))  Urinalysis, Routine w reflex microscopic Urine, Clean Catch     Status: Abnormal   Collection Time: 06/27/20  6:35 PM  Result Value Ref Range   Color, Urine AMBER (A) YELLOW   APPearance CLOUDY (A) CLEAR   Specific Gravity, Urine 1.027 1.005 - 1.030   pH 7.0 5.0 - 8.0   Glucose, UA NEGATIVE NEGATIVE mg/dL   Hgb urine dipstick SMALL (A) NEGATIVE   Bilirubin Urine SMALL (A) NEGATIVE   Ketones, ur NEGATIVE NEGATIVE mg/dL   Protein, ur 856 (A) NEGATIVE mg/dL   Nitrite NEGATIVE NEGATIVE   Leukocytes,Ua LARGE (A) NEGATIVE   RBC / HPF >50 (H) 0 - 5 RBC/hpf   WBC, UA >50 (H) 0 - 5 WBC/hpf   Bacteria, UA FEW (A) NONE SEEN   Squamous Epithelial / LPF 11-20 0 - 5   Mucus PRESENT    Trichomonas, UA PRESENT (A) NONE SEEN    MDM Pt informed that the ultrasound is considered a limited OB ultrasound and is not intended to be a complete ultrasound exam.  Patient also informed that the ultrasound is not being  completed with the intent of assessing for fetal or placental anomalies or any pelvic abnormalities.  Explained that the purpose of today's ultrasound is to assess for  viability.  Patient acknowledges the purpose of the exam and the limitations of the study.  Live IUP with FHR 186 bpm  U/a shows trichomonas on micro. Patient remains adamant that she has not had intercourse since being treated for trichomonas & chlamydia 2 weeks ago. Was given IV flagyl for trich tx last visit d/t persistent vomiting. Has not vomited in MAU today - will premedicate with scopolamine patch & oral reglan prior to get oral flagyl in MAU. If doesn't tolerate flagyl, will start IV for treatment.   Patient tolerated oral flagyl. Wants prescription for reglan & scop patches.  Assessment and Plan   1. Nausea and vomiting during pregnancy prior to [redacted] weeks gestation  -d/c phenergan -Rx reglan & scop patch  2. Trichomonal vaginitis during pregnancy in first trimester  -given flagyl in MAU -no intercourse x 2 weeks  3. [redacted] weeks gestation of pregnancy      Judeth Horn 06/27/2020, 9:49 PM

## 2020-06-27 NOTE — Discharge Instructions (Signed)
[facility] [employer] [facility] [employer] at [facility]  [address], [place], [place] [address]  [phone]  Center for [facility] at [facility]  [address], [place], [place] [address]  [phone]  Center for [facility] at [facility] [address] [place], [place] [address]  [phone]  Center for [facility] at [facility]  [address] [place], [place] [address]  [phone]  Center for [facility] at [facility]  [address] (First floor), [place], [place] [address]  [phone]  Center for [facility] [address], [place], [place] [address] [phone]  Center for [facility] at [facility]  [address] [place], [place], [place] [address]  [phone]  [facility]  [address], [place], [place] [address]  [phone]  [facility]  [address] [place], [place], [place] [address]  [phone]  [facility]  [address] [place], [place] [address]  [phone]  [facility]  [address] [place], [place] [address]  [phone]  [facility]  [address], [place], [place] [address]  [phone]  [facility]   [address] [place], [place] [address]  [phone]  [facility]  [address], [place], [place] [address]   [phone]  [facility]  [address], [place], [place] [address]  [phone]       Trichomoniasis Trichomoniasis is an STI (sexually transmitted infection) that can affect both women and men. In women, the outer area of the female genitalia (vulva) and the vagina are affected. In men, mainly the penis is affected, but the prostate and other reproductive organs can also be involved.  This condition can be treated with medicine. It often has no symptoms (is asymptomatic), especially in men. If not  treated, trichomoniasis can last for months or years. What are the causes? This condition is caused by a parasite called Trichomonas vaginalis. Trichomoniasis most often spreads from person to person (is contagious) through sexual contact. What increases the risk? The following factors may make you more likely to develop this condition:  Having unprotected sex.  Having sex with a partner who has trichomoniasis.  Having multiple sexual partners.  Having had previous trichomoniasis infections or other STIs. What are the signs or symptoms? In women, symptoms of trichomoniasis include:  Abnormal vaginal discharge that is clear, white, gray, or yellow-green and foamy and has an unusual "fishy" odor.  Itching and irritation of the vagina and vulva.  Burning or pain during urination or sex.  Redness and swelling of the genitals. In men, symptoms of trichomoniasis include:  Penile discharge that may be foamy or contain pus.  Pain in the penis. This may happen only when urinating.  Itching or irritation inside the penis.  Burning after urination or ejaculation. How is this diagnosed? In women, this condition may be found during a routine Pap test or physical exam. It may be found in men during a routine physical exam. Your health care provider may do tests to help diagnose this infection, such as:  Urine tests (men and women).  The following in women: ? Testing the pH of the vagina. ? A vaginal swab test that checks for the Trichomonas vaginalis parasite. ? Testing vaginal secretions. Your health care provider may test you for other STIs, including HIV (human  immunodeficiency virus). How is this treated? This condition is treated with medicine taken by mouth (orally), such as metronidazole or tinidazole, to fight the infection. Your sexual partner(s) also need to be tested and treated.  If you are a woman and you plan to become pregnant or think you may be pregnant, tell your  health care provider right away. Some medicines that are used to treat the infection should not be taken during pregnancy. Your health care provider may recommend over-the-counter medicines or creams to help relieve itching or irritation. You may be tested for infection again 3 months after treatment.   Follow these instructions at home:  Take and use over-the-counter and prescription medicines, including creams, only as told by your health care provider.  Take your antibiotic medicine as told by your health care provider. Do not stop taking the antibiotic even if you start to feel better.  Do not have sex until 7-10 days after you finish your medicine, or until your health care provider approves. Ask your health care provider when you may start to have sex again.  (Women) Do not douche or wear tampons while you have the infection.  Discuss your infection with your sexual partner(s). Make sure that your partner gets tested and treated, if necessary.  Keep all follow-up visits as told by your health care provider. This is important. How is this prevented?  Use condoms every time you have sex. Using condoms correctly and consistently can help protect against STIs.  Avoid having multiple sexual partners.  Talk with your sexual partner about any symptoms that either of you may have, as well as any history of STIs.  Get tested for STIs and STDs (sexually transmitted diseases) before you have sex. Ask your partner to do the same.  Do not have sexual contact if you have symptoms of trichomoniasis or another STI.   Contact a health care provider if:  You still have symptoms after you finish your medicine.  You develop pain in your abdomen.  You have pain when you urinate.  You have bleeding after sex.  You develop a rash.  You feel nauseous or you vomit.  You plan to become pregnant or think you may be pregnant. Summary  Trichomoniasis is an STI (sexually transmitted infection)  that can affect both women and men.  This condition often has no symptoms (is asymptomatic), especially in men.  Without treatment, this condition can last for months or years.  You should not have sex until 7-10 days after you finish your medicine, or until your health care provider approves. Ask your health care provider when you may start to have sex again.  Discuss your infection with your sexual partner(s). Make sure that your partner gets tested and treated, if necessary. This information is not intended to replace advice given to you by your health care provider. Make sure you discuss any questions you have with your health care provider. Document Revised: 12/14/2017 Document Reviewed: 12/14/2017 Elsevier Patient Education  2021 [employer].

## 2020-07-17 ENCOUNTER — Ambulatory Visit (INDEPENDENT_AMBULATORY_CARE_PROVIDER_SITE_OTHER): Payer: Medicaid Other

## 2020-07-17 DIAGNOSIS — Z3400 Encounter for supervision of normal first pregnancy, unspecified trimester: Secondary | ICD-10-CM

## 2020-07-17 DIAGNOSIS — O98311 Other infections with a predominantly sexual mode of transmission complicating pregnancy, first trimester: Secondary | ICD-10-CM

## 2020-07-17 DIAGNOSIS — Z3A00 Weeks of gestation of pregnancy not specified: Secondary | ICD-10-CM

## 2020-07-17 DIAGNOSIS — O23591 Infection of other part of genital tract in pregnancy, first trimester: Secondary | ICD-10-CM

## 2020-07-17 DIAGNOSIS — A568 Sexually transmitted chlamydial infection of other sites: Secondary | ICD-10-CM

## 2020-07-17 DIAGNOSIS — Z3401 Encounter for supervision of normal first pregnancy, first trimester: Secondary | ICD-10-CM

## 2020-07-17 DIAGNOSIS — A5901 Trichomonal vulvovaginitis: Secondary | ICD-10-CM

## 2020-07-17 MED ORDER — PREPLUS 27-1 MG PO TABS
1.0000 | ORAL_TABLET | Freq: Every day | ORAL | 13 refills | Status: AC
Start: 2020-07-17 — End: ?

## 2020-07-17 MED ORDER — BLOOD PRESSURE KIT DEVI: 0 refills | Status: AC

## 2020-07-17 NOTE — Progress Notes (Signed)
I connected with  Amber Shea on 07/17/20 at  2:00 PM EDT by telephone and verified that I am speaking with the correct person using two identifiers.   I discussed the limitations, risks, security and privacy concerns of performing an evaluation and management service by telephone and the availability of in person appointments. I also discussed with the patient that there may be a patient responsible charge related to this service. The patient expressed understanding and agreed to proceed.  Amber Shea Amber Shea, CMA 07/17/2020  2:13 PM   PRENATAL INTAKE SUMMARY  Amber Shea presents today New OB Nurse Interview.  OB History    Gravida  1   Para      Term      Preterm      AB      Living        SAB      IAB      Ectopic      Multiple      Live Births             I have reviewed the patient's medical, obstetrical, social, and family histories, medications, and available lab results.  SUBJECTIVE She Has no unusual complaints other some nausea.  OBJECTIVE Initial Physical Exam (New OB) LMP: 05/01/2020  GENERAL APPEARANCE: virtual visit  ASSESSMENT  Normal pregnancy  PLAN Prenatal care OB Pnl/HIV  OB Urine Culture GC/CT HgbEval SMA CF A1C Glucose   If CHTN - P/C Ratio and CMP

## 2020-08-06 ENCOUNTER — Ambulatory Visit (INDEPENDENT_AMBULATORY_CARE_PROVIDER_SITE_OTHER): Payer: Medicaid Other | Admitting: Certified Nurse Midwife

## 2020-08-06 ENCOUNTER — Encounter: Payer: Self-pay | Admitting: Certified Nurse Midwife

## 2020-08-06 ENCOUNTER — Other Ambulatory Visit (HOSPITAL_COMMUNITY)
Admission: RE | Admit: 2020-08-06 | Discharge: 2020-08-06 | Disposition: A | Discharge status: Home / Self Care | Payer: Medicaid Other | Source: Ambulatory Visit | Attending: Certified Nurse Midwife | Admitting: Certified Nurse Midwife

## 2020-08-06 ENCOUNTER — Other Ambulatory Visit: Payer: Self-pay

## 2020-08-06 VITALS — BP 103/64 | HR 82 | Wt 127.0 lb

## 2020-08-06 DIAGNOSIS — Z3A13 13 weeks gestation of pregnancy: Secondary | ICD-10-CM | POA: Diagnosis not present

## 2020-08-06 DIAGNOSIS — O219 Vomiting of pregnancy, unspecified: Secondary | ICD-10-CM | POA: Diagnosis not present

## 2020-08-06 DIAGNOSIS — R8271 Bacteriuria: Secondary | ICD-10-CM | POA: Diagnosis not present

## 2020-08-06 DIAGNOSIS — Z3401 Encounter for supervision of normal first pregnancy, first trimester: Secondary | ICD-10-CM | POA: Diagnosis present

## 2020-08-06 NOTE — Progress Notes (Signed)
New OB, reports no concerns today. However, patient is withdrawn.  PHQ-9=5

## 2020-08-06 NOTE — Progress Notes (Signed)
History:   Amber Shea is a 30 y.o. G1P0 at [redacted]w[redacted]d by LMP being seen today for her first obstetrical visit.  Her obstetrical history is significant for nothing, this is her first pregnancy. She has a history of dysmenorrhea with menarche and was treated from 13-16 with Depo-Provera that caused heavy irregular bleeding. Was told she would not be able to have children. Patient does intend to breast feed. Pregnancy history fully reviewed.  Patient reports nausea and vomiting. She is still vomiting several times per day and has not been able to keep any of her antiemetics, antibiotic treatment or PNV down. After initial antibiotic therapy for trichomonas, pt notes her discharge has changed and become yellow and much more malodorous.       HISTORY: OB History  Gravida Para Term Preterm AB Living  1 0 0 0 0 0  SAB IAB Ectopic Multiple Live Births  0 0 0 0 0    # Outcome Date GA Lbr Len/2nd Weight Sex Delivery Anes PTL Lv  1 Current             No previous pap, will be performed today.  Past Medical History:  Diagnosis Date  . Medical history non-contributory    Past Surgical History:  Procedure Laterality Date  . NO PAST SURGERIES     Family History  Problem Relation Age of Onset  . Healthy Mother   . Healthy Father   . Diabetes Brother   . Diabetes Maternal Grandfather   . Cancer Paternal Grandmother    Social History   Tobacco Use  . Smoking status: Never Smoker  . Smokeless tobacco: Never Used  Vaping Use  . Vaping Use: Never used  Substance Use Topics  . Alcohol use: Not Currently  . Drug use: Not Currently    Types: Marijuana    Comment: "I stopped when I found out I was pregnant"   No Known Allergies Current Outpatient Medications on File Prior to Visit  Medication Sig Dispense Refill  . Blood Pressure Monitoring (BLOOD PRESSURE KIT) DEVI Please check blood pressure 1-2 times per week 1 each 0  . metoCLOPramide (REGLAN) 10 MG tablet Take 1 tablet (10 mg total)  by mouth every 8 (eight) hours as needed for nausea. (Patient not taking: Reported on 07/17/2020) 30 tablet 1  . Prenatal Vit-Fe Fumarate-FA (PREPLUS) 27-1 MG TABS Take 1 tablet by mouth daily. 30 tablet 13   No current facility-administered medications on file prior to visit.   Review of Systems Pertinent items noted in HPI and remainder of comprehensive ROS otherwise negative.  Physical Exam:   Vitals:   08/06/20 0827  BP: 103/64  Pulse: 82  Weight: 127 lb (57.6 kg)   Fetal Heart Rate (bpm): 147  Constitutional: Well-developed, well-nourished pregnant female in no acute distress.  HEENT: PERRLA Skin: normal color and turgor, no rash Cardiovascular: normal rate & rhythm, no murmur Respiratory: normal effort, lung sounds clear throughout GI: Abd soft, non-tender, pos BS x 4, gravid appropriate for gestational age MS: Extremities nontender, no edema, normal ROM Neurologic: Alert and oriented x 4.  GU: no CVA tenderness Pelvic: NEFG, copious thin yellow discharge, no blood, cervix visually closed. Pap/swabs collected    Assessment:    Pregnancy: G1P0 Patient Active Problem List   Diagnosis Date Noted  . Encounter for supervision of normal first pregnancy in first trimester 07/17/2020  . Trichomonal vaginitis during pregnancy in first trimester 06/12/2020  . Chlamydia trachomatis infection in mother during  first trimester of pregnancy 06/12/2020     Plan:    1. Supervision of low-risk first pregnancy, first trimester - Cervicovaginal ancillary only( [place]) - Culture, OB Urine - Genetic Screening - Cytology - PAP( [place]) - Hepatitis C Antibody - Enroll Patient in PreNatal Babyscripts - Babyscripts Schedule Optimization - [place] MFM OB COMP + 68 WK; Future - Obstetric Panel, Including HIV  2. [redacted] weeks gestation of pregnancy Routine new OB care including: - Initial labs drawn. - Restart prenatal vitamins once nausea has improved - Problem list reviewed and  updated. - Genetic Screening discussed, First trimester screen, Quad screen and NIPS: ordered. - Ultrasound discussed; fetal anatomic survey: ordered. - Anticipatory guidance about prenatal visits given including labs, ultrasounds, and testing. - Discussed usage of Babyscripts and virtual visits as additional source of managing and completing prenatal visits in midst of coronavirus and pandemic.   - Encouraged to complete MyChart Registration for her ability to review results, send requests, and have questions addressed.  - The nature of [place] [facility] with multiple MDs and Advanced Practice Providers was explained to patient; also emphasized that residents, students are part of our team. - Routine obstetric precautions reviewed. Encouraged to seek out care at office or emergency room [facility] preferred) for urgent and/or emergent concerns.  3. Nausea and vomiting of pregnancy, prior to 22 weeks Reviewed techniques to improve nausea including placing phenergan vaginally, acupressure points, use of peppermint oil behind the ears, and trying to eat smaller amounts throughout the day.  Return in about 4 weeks (around 09/03/2020) for IN-PERSON, LOB.     Gaylan Gerold, MSN, CNM, [place] Certified Nurse Midwife, [facility]

## 2020-08-06 NOTE — Patient Instructions (Signed)

## 2020-08-07 LAB — OBSTETRIC PANEL, INCLUDING HIV
Antibody Screen: NEGATIVE
Basophils Absolute: 0.1 10*3/uL (ref 0.0–0.2)
Basos: 1 %
EOS (ABSOLUTE): 0.2 10*3/uL (ref 0.0–0.4)
Eos: 2 %
HIV Screen 4th Generation wRfx: NONREACTIVE
Hematocrit: 33.6 % — ABNORMAL LOW (ref 34.0–46.6)
Hemoglobin: 11 g/dL — ABNORMAL LOW (ref 11.1–15.9)
Hepatitis B Surface Ag: NEGATIVE
Immature Grans (Abs): 0 10*3/uL (ref 0.0–0.1)
Immature Granulocytes: 0 %
Lymphocytes Absolute: 2.1 10*3/uL (ref 0.7–3.1)
Lymphs: 23 %
MCH: 28.6 pg (ref 26.6–33.0)
MCHC: 32.7 g/dL (ref 31.5–35.7)
MCV: 87 fL (ref 79–97)
Monocytes Absolute: 0.7 10*3/uL (ref 0.1–0.9)
Monocytes: 7 %
Neutrophils Absolute: 6.2 10*3/uL (ref 1.4–7.0)
Neutrophils: 67 %
Platelets: 336 10*3/uL (ref 150–450)
RBC: 3.85 x10E6/uL (ref 3.77–5.28)
RDW: 15 % (ref 11.7–15.4)
RPR Ser Ql: NONREACTIVE
Rh Factor: POSITIVE
Rubella Antibodies, IGG: 1.58 index (ref >0.99)
WBC: 9.2 10*3/uL (ref 3.4–10.8)

## 2020-08-07 LAB — CYTOLOGY - PAP
Comment: NEGATIVE
Diagnosis: NEGATIVE
High risk HPV: NEGATIVE

## 2020-08-07 LAB — CERVICOVAGINAL ANCILLARY ONLY
Chlamydia: NEGATIVE
Comment: NEGATIVE
Comment: NORMAL
Neisseria Gonorrhea: NEGATIVE

## 2020-08-07 LAB — HEPATITIS C ANTIBODY: Hep C Virus Ab: <0.1 s/co ratio (ref 0.0–0.9)

## 2020-08-10 LAB — CULTURE, OB URINE

## 2020-08-10 LAB — URINE CULTURE, OB REFLEX

## 2020-08-14 ENCOUNTER — Encounter: Payer: Self-pay | Admitting: Obstetrics and Gynecology

## 2020-08-14 DIAGNOSIS — R8271 Bacteriuria: Secondary | ICD-10-CM | POA: Insufficient documentation

## 2020-08-14 MED ORDER — AMOXICILLIN 500 MG PO CAPS: 500.0000 mg | ORAL_CAPSULE | Freq: Three times a day (TID) | ORAL | 0 refills | Status: AC

## 2020-08-14 NOTE — Addendum Note (Signed)
Addended by: Edd Arbour on: 08/14/2020 09:31 PM   Modules accepted: Orders

## 2020-08-20 ENCOUNTER — Telehealth: Payer: Self-pay

## 2020-08-20 NOTE — Telephone Encounter (Signed)
Returned patient call regarding reduced work hours. No answer or voice mail.

## 2020-08-26 ENCOUNTER — Encounter: Payer: Self-pay | Admitting: Obstetrics and Gynecology

## 2020-09-02 ENCOUNTER — Encounter (HOSPITAL_COMMUNITY): Payer: Self-pay | Admitting: Obstetrics & Gynecology

## 2020-09-02 ENCOUNTER — Inpatient Hospital Stay (HOSPITAL_COMMUNITY)
Admission: AD | Admit: 2020-09-02 | Discharge: 2020-09-02 | Disposition: A | Discharge status: Home / Self Care | Payer: Medicaid Other | Attending: Obstetrics & Gynecology | Admitting: Obstetrics & Gynecology

## 2020-09-02 ENCOUNTER — Other Ambulatory Visit: Payer: Self-pay

## 2020-09-02 DIAGNOSIS — O23592 Infection of other part of genital tract in pregnancy, second trimester: Secondary | ICD-10-CM

## 2020-09-02 DIAGNOSIS — A599 Trichomoniasis, unspecified: Secondary | ICD-10-CM

## 2020-09-02 DIAGNOSIS — M549 Dorsalgia, unspecified: Secondary | ICD-10-CM | POA: Insufficient documentation

## 2020-09-02 DIAGNOSIS — O98312 Other infections with a predominantly sexual mode of transmission complicating pregnancy, second trimester: Secondary | ICD-10-CM | POA: Diagnosis not present

## 2020-09-02 DIAGNOSIS — O99891 Other specified diseases and conditions complicating pregnancy: Secondary | ICD-10-CM

## 2020-09-02 DIAGNOSIS — M5431 Sciatica, right side: Secondary | ICD-10-CM

## 2020-09-02 DIAGNOSIS — O26892 Other specified pregnancy related conditions, second trimester: Secondary | ICD-10-CM | POA: Insufficient documentation

## 2020-09-02 DIAGNOSIS — M5441 Lumbago with sciatica, right side: Secondary | ICD-10-CM | POA: Diagnosis not present

## 2020-09-02 DIAGNOSIS — A5901 Trichomonal vulvovaginitis: Secondary | ICD-10-CM | POA: Insufficient documentation

## 2020-09-02 DIAGNOSIS — Z3A17 17 weeks gestation of pregnancy: Secondary | ICD-10-CM | POA: Insufficient documentation

## 2020-09-02 LAB — URINALYSIS, ROUTINE W REFLEX MICROSCOPIC
Bilirubin Urine: NEGATIVE
Glucose, UA: NEGATIVE mg/dL
Hgb urine dipstick: NEGATIVE
Ketones, ur: NEGATIVE mg/dL
Nitrite: NEGATIVE
Protein, ur: NEGATIVE mg/dL
Specific Gravity, Urine: 1.02 (ref 1.005–1.030)
pH: 6 (ref 5.0–8.0)

## 2020-09-02 LAB — CBC WITH DIFFERENTIAL/PLATELET
Abs Immature Granulocytes: 0.05 10*3/uL (ref 0.00–0.07)
Basophils Absolute: 0.1 10*3/uL (ref 0.0–0.1)
Basophils Relative: 1 %
Eosinophils Absolute: 0.2 10*3/uL (ref 0.0–0.5)
Eosinophils Relative: 2 %
HCT: 28.6 % — ABNORMAL LOW (ref 36.0–46.0)
Hemoglobin: 9.1 g/dL — ABNORMAL LOW (ref 12.0–15.0)
Immature Granulocytes: 1 %
Lymphocytes Relative: 22 %
Lymphs Abs: 2.2 10*3/uL (ref 0.7–4.0)
MCH: 29.1 pg (ref 26.0–34.0)
MCHC: 31.8 g/dL (ref 30.0–36.0)
MCV: 91.4 fL (ref 80.0–100.0)
Monocytes Absolute: 1 10*3/uL (ref 0.1–1.0)
Monocytes Relative: 10 %
Neutro Abs: 6.6 10*3/uL (ref 1.7–7.7)
Neutrophils Relative %: 64 %
Platelets: 298 10*3/uL (ref 150–400)
RBC: 3.13 MIL/uL — ABNORMAL LOW (ref 3.87–5.11)
RDW: 15.2 % (ref 11.5–15.5)
WBC: 10 10*3/uL (ref 4.0–10.5)
nRBC: 0 % (ref 0.0–0.2)

## 2020-09-02 MED ORDER — ONDANSETRON HCL 4 MG/2ML IJ SOLN: 4.0000 mg | Freq: Once | INTRAMUSCULAR | Status: DC

## 2020-09-02 MED ORDER — LACTATED RINGERS IV SOLN: INTRAVENOUS | Status: DC

## 2020-09-02 MED ORDER — METRONIDAZOLE IVPB CUSTOM
2000.0000 mg | Freq: Once | INTRAVENOUS | Status: AC
  Administered 2020-09-02: 2000 mg via INTRAVENOUS
  Filled 2020-09-02: qty 400

## 2020-09-02 NOTE — MAU Provider Note (Signed)
History     CSN: [id number]  Arrival date and time: 09/02/20 1105   Event Date/Time   First Provider Initiated Contact with Patient 09/02/20 1258      Chief Complaint  Patient presents with   Abdominal Pain   Pelvic Pain   HPI Amber Shea is a 30 y.o. G1P0 at 48w5dwho presents with abdominal and back pain.  Symptoms started over 2 weeks ago. Reports right sided abdominal pain that is intermittent & sharp. Occurs daily but can't tell frequency otherwise. No precipitating factors. Hasn't treated symptoms. Continues to have daily n/v, normally vomits at least 3 times per day. Stopped taking her antiemetics over a week ago b/c didn't think they have been working. Denies fever, chills, diarrhea, constipation, dysuria, hematuria, LOF, or vaginal bleeding. Continues to have foul smelling yellow discharge. Was treated for trich last month but states she vomited once she got home & feels like she vomited the medication.  Also reports right lower back pain that radiates down her right leg.    OB History     Gravida  1   Para      Term      Preterm      AB      Living         SAB      IAB      Ectopic      Multiple      Live Births              Past Medical History:  Diagnosis Date   Medical history non-contributory     Past Surgical History:  Procedure Laterality Date   NO PAST SURGERIES      Family History  Problem Relation Age of Onset   Healthy Mother    Healthy Father    Diabetes Brother    Diabetes Maternal Grandfather    Cancer Paternal Grandmother     Social History   Tobacco Use   Smoking status: Never   Smokeless tobacco: Never  Vaping Use   Vaping Use: Never used  Substance Use Topics   Alcohol use: Not Currently   Drug use: Not Currently    Types: Marijuana    Comment: "I stopped when I found out I was pregnant"    Allergies: No Known Allergies  Medications Prior to Admission  Medication Sig Dispense Refill Last Dose    Prenatal Vit-Fe Fumarate-FA (PREPLUS) 27-1 MG TABS Take 1 tablet by mouth daily. 30 tablet 13 Past Month   Blood Pressure Monitoring (BLOOD PRESSURE KIT) DEVI Please check blood pressure 1-2 times per week 1 each 0    metoCLOPramide (REGLAN) 10 MG tablet Take 1 tablet (10 mg total) by mouth every 8 (eight) hours as needed for nausea. 30 tablet 1     Review of Systems  Constitutional: Negative.   Gastrointestinal:  Positive for abdominal pain, nausea and vomiting. Negative for constipation and diarrhea.  Genitourinary:  Positive for vaginal discharge. Negative for dysuria, hematuria and vaginal bleeding.  Musculoskeletal:  Positive for back pain.  Physical Exam   Blood pressure (!) 108/59, pulse 84, temperature 98.6 F (37 C), temperature source Oral, resp. rate 16, height 5' 2.5" (1.588 m), weight 60.9 kg, last menstrual period 05/01/2020, SpO2 98 %.  Physical Exam Vitals and nursing note reviewed. Exam conducted with a chaperone present.  Constitutional:      General: She is not in acute distress.    Appearance: She is well-developed and normal weight.  HENT:     Head: Normocephalic and atraumatic.  Eyes:     General: No scleral icterus. Pulmonary:     Effort: Pulmonary effort is normal. No respiratory distress.  Abdominal:     Palpations: Abdomen is soft.     Tenderness: There is abdominal tenderness in the right lower quadrant. There is no right CVA tenderness, left CVA tenderness, guarding or rebound.  Genitourinary:    Comments: Dilation: Closed Effacement (%): Thick Exam by:: Jorje Guild, NP  Skin:    General: Skin is warm and dry.  Neurological:     Mental Status: She is alert.  Psychiatric:        Mood and Affect: Mood normal.        Behavior: Behavior normal.    MAU Course  Procedures Results for orders placed or performed during the hospital encounter of 09/02/20 (from the past 24 hour(s))  Urinalysis, Routine w reflex microscopic Urine, Clean Catch      Status: Abnormal   Collection Time: 09/02/20 12:55 PM  Result Value Ref Range   Color, Urine YELLOW YELLOW   APPearance CLOUDY (A) CLEAR   Specific Gravity, Urine 1.020 1.005 - 1.030   pH 6.0 5.0 - 8.0   Glucose, UA NEGATIVE NEGATIVE mg/dL   Hgb urine dipstick NEGATIVE NEGATIVE   Bilirubin Urine NEGATIVE NEGATIVE   Ketones, ur NEGATIVE NEGATIVE mg/dL   Protein, ur NEGATIVE NEGATIVE mg/dL   Nitrite NEGATIVE NEGATIVE   Leukocytes,Ua LARGE (A) NEGATIVE   RBC / HPF 6-10 0 - 5 RBC/hpf   WBC, UA 21-50 0 - 5 WBC/hpf   Bacteria, UA RARE (A) NONE SEEN   Squamous Epithelial / LPF 6-10 0 - 5   Mucus PRESENT    Trichomonas, UA PRESENT (A) NONE SEEN  CBC with Differential/Platelet     Status: Abnormal   Collection Time: 09/02/20  1:20 PM  Result Value Ref Range   WBC 10.0 4.0 - 10.5 K/uL   RBC 3.13 (L) 3.87 - 5.11 MIL/uL   Hemoglobin 9.1 (L) 12.0 - 15.0 g/dL   HCT 28.6 (L) 36.0 - 46.0 %   MCV 91.4 80.0 - 100.0 fL   MCH 29.1 26.0 - 34.0 pg   MCHC 31.8 30.0 - 36.0 g/dL   RDW 15.2 11.5 - 15.5 %   Platelets 298 150 - 400 K/uL   nRBC 0.0 0.0 - 0.2 %   Neutrophils Relative % 64 %   Neutro Abs 6.6 1.7 - 7.7 K/uL   Lymphocytes Relative 22 %   Lymphs Abs 2.2 0.7 - 4.0 K/uL   Monocytes Relative 10 %   Monocytes Absolute 1.0 0.1 - 1.0 K/uL   Eosinophils Relative 2 %   Eosinophils Absolute 0.2 0.0 - 0.5 K/uL   Basophils Relative 1 %   Basophils Absolute 0.1 0.0 - 0.1 K/uL   Immature Granulocytes 1 %   Abs Immature Granulocytes 0.05 0.00 - 0.07 K/uL    MDM FHT present via doppler.  Patient complains of abdominal pain that is consistent with round ligament. Cervix is closed and she is currently without pain. She had a positive urine culture last month but was unable to keep down abx due to vomiting. She has no urinary complaints here - will repeat culture.  Continues to test positive for trichomonas. She is adamant that she has not had intercourse since the first dx & treatment. Was tx in  April but reports vomiting the medication shortly after taking it. Was diagnosed again last  month but states her provider told her she shouldn't take the medicine again if she can't keep it down. Will repeat IV dosing of flagyl while here in [place]. May consider 7 day course of flagyl if TOC remains positive depending on HEG symptoms.   Dr. Nehemiah Settle saw patient due to back pain. Will see patient for f/u in the office at [facility].    Assessment and Plan   1. Back pain affecting pregnancy in second trimester  -message sent to [facility] to f/u with Dr. Nehemiah Settle for back pain  2. Trichomonal vaginitis during pregnancy, second trimester  -no intercourse x 2 weeks -will get TOC in 3 weeks  3. Sciatica of right side   4. [redacted] weeks gestation of pregnancy      Jorje Guild 09/02/2020, 3:31 PM

## 2020-09-02 NOTE — MAU Note (Signed)
Amber Shea is a 30 y.o. at [redacted]w[redacted]d here in MAU reporting: sharp pelvic and abdominal pain for 2.5 weeks. States today is has been unbearable. Also having some sciatic nerve pain. No bleeding. Having some yellow discharge, no itching, mild odor.   Onset of complaint: ongoing  Pain score: 7/10  Vitals:   09/02/20 1132  BP: 108/62  Pulse: 87  Resp: 16  Temp: 98.6 F (37 C)  SpO2: 98%     FHT:146  Lab orders placed from triage: UA

## 2020-09-03 LAB — CULTURE, OB URINE
Culture: >=100000 — AB
Special Requests: NORMAL

## 2020-09-04 ENCOUNTER — Other Ambulatory Visit: Payer: Self-pay | Admitting: Student

## 2020-09-04 ENCOUNTER — Telehealth: Payer: Self-pay | Admitting: Student

## 2020-09-04 DIAGNOSIS — R8271 Bacteriuria: Secondary | ICD-10-CM

## 2020-09-04 DIAGNOSIS — N390 Urinary tract infection, site not specified: Secondary | ICD-10-CM | POA: Insufficient documentation

## 2020-09-04 DIAGNOSIS — N3000 Acute cystitis without hematuria: Secondary | ICD-10-CM

## 2020-09-04 MED ORDER — CEFADROXIL 500 MG PO CAPS: 500.0000 mg | ORAL_CAPSULE | Freq: Two times a day (BID) | ORAL | 0 refills | Status: AC

## 2020-09-04 NOTE — Telephone Encounter (Signed)
Called patient's number and her mother answered, states that the number [facility] has been calling is incorrect. Mother confirmed patient's cell phone number and I updated Epict. Attempted to call patient and phone went to vmail which stated "person has a vmail that has not been set up". Will attempt to call again later today.   Amber Shea

## 2020-09-04 NOTE — Telephone Encounter (Signed)
Attempted to reach patient again by phone; no answer and vmail not set up. Will send My Chart message.

## 2020-09-06 ENCOUNTER — Other Ambulatory Visit: Payer: Self-pay

## 2020-09-06 ENCOUNTER — Ambulatory Visit (INDEPENDENT_AMBULATORY_CARE_PROVIDER_SITE_OTHER): Payer: Medicaid Other | Admitting: Obstetrics and Gynecology

## 2020-09-06 VITALS — BP 104/66 | HR 87 | Wt 136.0 lb

## 2020-09-06 DIAGNOSIS — M5430 Sciatica, unspecified side: Secondary | ICD-10-CM | POA: Diagnosis not present

## 2020-09-06 DIAGNOSIS — A5901 Trichomonal vulvovaginitis: Secondary | ICD-10-CM | POA: Diagnosis not present

## 2020-09-06 DIAGNOSIS — Z3401 Encounter for supervision of normal first pregnancy, first trimester: Secondary | ICD-10-CM

## 2020-09-06 DIAGNOSIS — O23591 Infection of other part of genital tract in pregnancy, first trimester: Secondary | ICD-10-CM

## 2020-09-06 NOTE — Progress Notes (Signed)
Pt states she is having increase in back pain.  Pt is having some mild cramping.

## 2020-09-06 NOTE — Progress Notes (Signed)
   PRENATAL VISIT NOTE  Subjective:  Amber Shea is a 30 y.o. G1P0 at [redacted]w[redacted]d being seen today for ongoing prenatal care.  She is currently monitored for the following issues for this low-risk pregnancy and has Trichomonal vaginitis during pregnancy in first trimester; Chlamydia trachomatis infection in mother during first trimester of pregnancy; Encounter for supervision of normal first pregnancy in first trimester; GBS bacteriuria; and UTI (urinary tract infection) on their problem list.  Patient reports sciatic nerve pain.   Contractions: Not present. Vag. Bleeding: None.   . Denies leaking of fluid.   The following portions of the patient's history were reviewed and updated as appropriate: allergies, current medications, past family history, past medical history, past social history, past surgical history and problem list.   Objective:   Vitals:   09/06/20 0827  BP: 104/66  Pulse: 87  Weight: 136 lb (61.7 kg)    Fetal Status: Fetal Heart Rate (bpm): 140         General:  Alert, oriented and cooperative. Patient is in no acute distress.  Skin: Skin is warm and dry. No rash noted.   Cardiovascular: Normal heart rate noted  Respiratory: Normal respiratory effort, no problems with respiration noted  Abdomen: Soft, gravid, appropriate for gestational age.  Pain/Pressure: Absent     Pelvic: Cervical exam deferred        Extremities: Normal range of motion.     Mental Status: Normal mood and affect. Normal behavior. Normal judgment and thought content.   Assessment and Plan:  Pregnancy: G1P0 at [redacted]w[redacted]d  1. Encounter for supervision of normal first pregnancy in first trimester  - AFP, Serum, Open Spina Bifida - ambulatory referral to PT  2. Trichomonal vaginitis during pregnancy in first trimester  She was treated with IV flagyl in MAU on 6/20 after she vomited the oral flagyl. Will need TOC 7/20. Recommended 7 days of flagyl however patient says she cannot keep it down.   Preterm  labor symptoms and general obstetric precautions including but not limited to vaginal bleeding, contractions, leaking of fluid and fetal movement were reviewed in detail with the patient. Please refer to After Visit Summary for other counseling recommendations.   No follow-ups on file.  Future Appointments  Date Time Provider Department Center  09/11/2020  8:30 AM [facility] US3 [facility]  10/23/2020  3:10 PM Levie Heritage, DO [facility] None    Venia Carbon, NP

## 2020-09-08 LAB — AFP, SERUM, OPEN SPINA BIFIDA
AFP MoM: 1.14
AFP Value: 61.3 ng/mL
Gest. Age on Collection Date: 18 weeks
Maternal Age At EDD: 28.3 yr
OSBR Risk 1 IN: 10000
Test Results:: NEGATIVE
Weight: 136 [lb_av]

## 2020-09-11 ENCOUNTER — Ambulatory Visit: Payer: Medicaid Other | Attending: Certified Nurse Midwife

## 2020-09-11 ENCOUNTER — Other Ambulatory Visit: Payer: Self-pay

## 2020-09-11 ENCOUNTER — Ambulatory Visit: Payer: Medicaid Other | Attending: Obstetrics and Gynecology | Admitting: Obstetrics and Gynecology

## 2020-09-11 ENCOUNTER — Other Ambulatory Visit: Payer: Self-pay | Admitting: *Deleted

## 2020-09-11 ENCOUNTER — Other Ambulatory Visit: Payer: Self-pay | Admitting: Certified Nurse Midwife

## 2020-09-11 DIAGNOSIS — Z3401 Encounter for supervision of normal first pregnancy, first trimester: Secondary | ICD-10-CM | POA: Insufficient documentation

## 2020-09-11 DIAGNOSIS — O289 Unspecified abnormal findings on antenatal screening of mother: Secondary | ICD-10-CM | POA: Diagnosis not present

## 2020-09-11 DIAGNOSIS — Z3A19 19 weeks gestation of pregnancy: Secondary | ICD-10-CM

## 2020-09-11 DIAGNOSIS — Z363 Encounter for antenatal screening for malformations: Secondary | ICD-10-CM | POA: Diagnosis not present

## 2020-09-11 DIAGNOSIS — Q270 Congenital absence and hypoplasia of umbilical artery: Secondary | ICD-10-CM

## 2020-09-11 NOTE — Progress Notes (Addendum)
Maternal-Fetal Medicine   Name: Amber Shea DOB: 1990/06/11 MRN: [id number] Referring Provider: Edd Arbour, CNM  I had the pleasure of seeing Ms. Hammac [facility] at [facility]. She is G1 P0 at 19-weeks' gestation and is here for fetal anatomy scan. On cell free fetal DNA screening, the risks of fetal aneuploidies are not increased.  Genetic carrier screening is negative. Patient reports no chronic medical conditions. Past surgical history: Nil of note. Ultrasound We performed fetal anatomy scan.  Amniotic fluid is normal and good fetal activity seen.  Fetal biometry is consistent with her previously established dates.  Single umbilical artery is seen.  No other markers of aneuploidies or fetal structural defects are seen.  Cardiac anatomy appears normal. I counseled the patient on the following:  Single umbilical artery (SUA) SUA is seen in about 1% of fetuses. In the absence of other anomalies, the risk for aneuploidies is not increased. However, ultrasound has limitations in detecting fetal anomalies that may be missed on target at anatomical survey.  SUA can be associated with fetal growth restriction and we recommend serial growth scans for fetal growth assessment. The association of SUA with cardiac anomalies is not conclusively proven.  I have recommended fetal echocardiography.    I discussed the significance and limitations of cell free fetal DNA screening in detecting aneuploidies.  I informed her only amniocentesis will give a definitive result on the fetal karyotype and some genetic conditions.  Explained amniocentesis procedure and possible complications including miscarriage (1 and 500 procedures).  Patient understands that ultrasound has limitations in detecting fetal anomalies.  She will discuss with her family and decide about amniocentesis.  Recommendations -An appointment was made for her to return in 5 weeks for fetal growth assessment. -We have  requested an appointment for fetal echocardiography [facility]). -Fetal growth assessments every 4 weeks.  Thank you for consultation. If you have any questions, please contact me at the [facility]. Consultation including face-to-face counseling 30 minutes.

## 2020-09-12 ENCOUNTER — Other Ambulatory Visit: Payer: Self-pay

## 2020-09-12 NOTE — Progress Notes (Signed)
Note provided to pt as requested for appt yesterday.

## 2020-09-24 ENCOUNTER — Telehealth: Payer: Self-pay

## 2020-09-24 NOTE — Telephone Encounter (Signed)
Attempted to call patient to advise [facility] is trying to reach her to schedule Fetal Echo-VM Not Set Up Yet.

## 2020-09-30 ENCOUNTER — Encounter: Payer: Medicaid Other | Admitting: Advanced Practice Midwife

## 2020-09-30 NOTE — Progress Notes (Deleted)
   PRENATAL VISIT NOTE  Subjective:  Amber Shea is a 30 y.o. G1P0 at [redacted]w[redacted]d being seen today for ongoing prenatal care.  She is currently monitored for the following issues for this {Blank single:19197::"high-risk","low-risk"} pregnancy and has Trichomonal vaginitis during pregnancy in first trimester; Chlamydia trachomatis infection in mother during first trimester of pregnancy; Encounter for supervision of normal first pregnancy in first trimester; GBS bacteriuria; and UTI (urinary tract infection) on their problem list.  Patient reports {sx:14538}.   .  .   . Denies leaking of fluid.   The following portions of the patient's history were reviewed and updated as appropriate: allergies, current medications, past family history, past medical history, past social history, past surgical history and problem list.   Objective:  There were no vitals filed for this visit.  Fetal Status:           General:  Alert, oriented and cooperative. Patient is in no acute distress.  Skin: Skin is warm and dry. No rash noted.   Cardiovascular: Normal heart rate noted  Respiratory: Normal respiratory effort, no problems with respiration noted  Abdomen: Soft, gravid, appropriate for gestational age.        Pelvic: {Blank single:19197::"Cervical exam performed in the presence of a chaperone","Cervical exam deferred"}        Extremities: Normal range of motion.     Mental Status: Normal mood and affect. Normal behavior. Normal judgment and thought content.   Assessment and Plan:  Pregnancy: G1P0 at [redacted]w[redacted]d 1. Trichomonal vaginitis during pregnancy, second trimester ***  2. UTI (urinary tract infection) during pregnancy, second trimester ***  3. Encounter for supervision of normal first pregnancy in first trimester ***  4. GBS bacteriuria ***  {Blank single:19197::"Term","Preterm"} labor symptoms and general obstetric precautions including but not limited to vaginal bleeding, contractions, leaking of  fluid and fetal movement were reviewed in detail with the patient. Please refer to After Visit Summary for other counseling recommendations.   No follow-ups on file.  Future Appointments  Date Time Provider Department Center  09/30/2020  2:40 PM Hurshel Party, CNM [facility] None  10/18/2020  3:00 PM [facility] NURSE [facility]  10/18/2020  3:15 PM [facility] US2 [facility]  10/30/2020  1:30 PM Adrian Blackwater, Rhona Raider, DO [facility] None    Sharen Counter, CNM

## 2020-10-02 ENCOUNTER — Telehealth: Payer: Self-pay

## 2020-10-02 NOTE — Telephone Encounter (Signed)
PATIENT DECLINED TO SCHEDULE FETAL ECHO 09/20/2020.

## 2020-10-18 ENCOUNTER — Ambulatory Visit: Payer: Medicaid Other

## 2020-10-18 ENCOUNTER — Ambulatory Visit: Payer: Medicaid Other | Attending: Obstetrics and Gynecology

## 2020-10-23 ENCOUNTER — Encounter: Payer: Medicaid Other | Admitting: Family Medicine

## 2020-10-30 ENCOUNTER — Encounter: Payer: Medicaid Other | Admitting: Family Medicine
